# Patient Record
Sex: Female | Born: 2000 | Race: Black or African American | Hispanic: No | Marital: Single | Smoking: Current some day smoker
Health system: Southern US, Community
[De-identification: ages and names within clinical notes are randomized; demographics above are authoritative.]

## PROBLEM LIST (undated history)

## (undated) DIAGNOSIS — Z789 Other specified health status: Secondary | ICD-10-CM

## (undated) DIAGNOSIS — H9191 Unspecified hearing loss, right ear: Secondary | ICD-10-CM

## (undated) HISTORY — DX: Unspecified hearing loss, right ear: H91.91

## (undated) HISTORY — PX: INTRAUTERINE DEVICE (IUD) INSERTION: SHX5877

---
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
## 2021-05-09 ENCOUNTER — Ambulatory Visit: Admit: 2021-05-09 | Payer: Medicaid Other

## 2021-06-16 ENCOUNTER — Emergency Department (HOSPITAL_BASED_OUTPATIENT_CLINIC_OR_DEPARTMENT_OTHER)
Admission: EM | Admit: 2021-06-16 | Discharge: 2021-06-16 | Disposition: A | Discharge status: Home / Self Care | Payer: BLUE CROSS/BLUE SHIELD | Attending: Emergency Medicine | Admitting: Emergency Medicine

## 2021-06-16 ENCOUNTER — Other Ambulatory Visit: Payer: Self-pay

## 2021-06-16 ENCOUNTER — Encounter (HOSPITAL_BASED_OUTPATIENT_CLINIC_OR_DEPARTMENT_OTHER): Payer: Self-pay

## 2021-06-16 DIAGNOSIS — R21 Rash and other nonspecific skin eruption: Secondary | ICD-10-CM | POA: Diagnosis present

## 2021-06-16 DIAGNOSIS — L509 Urticaria, unspecified: Secondary | ICD-10-CM | POA: Insufficient documentation

## 2021-06-16 MED ORDER — FAMOTIDINE 20 MG PO TABS
20.0000 mg | ORAL_TABLET | Freq: Once | ORAL | Status: AC
  Administered 2021-06-16: 20 mg via ORAL
  Filled 2021-06-16: qty 1

## 2021-06-16 MED ORDER — FAMOTIDINE 20 MG PO TABS: 20.0000 mg | ORAL_TABLET | Freq: Every day | ORAL | 0 refills | Status: AC

## 2021-06-16 MED ORDER — DIPHENHYDRAMINE HCL 25 MG PO TABS: 25.0000 mg | ORAL_TABLET | Freq: Four times a day (QID) | ORAL | 0 refills | Status: AC

## 2021-06-16 MED ORDER — DIPHENHYDRAMINE HCL 25 MG PO CAPS
25.0000 mg | ORAL_CAPSULE | Freq: Once | ORAL | Status: AC
  Administered 2021-06-16: 25 mg via ORAL
  Filled 2021-06-16: qty 1

## 2021-06-16 NOTE — ED Triage Notes (Signed)
Pt presents POV with rash to face, torso, back and legs starting yesterday.  No angioedema, denies any shortness of breath or difficulty breathing.

## 2021-06-16 NOTE — ED Provider Notes (Signed)
[facility] EMERGENCY DEPT Provider Note  CSN: [id number] Arrival date & time: 06/16/21 2053  Chief Complaint(s) Rash  HPI Melanie Lam is a 20 y.o. female   The history is provided by the patient.  Rash Location:  Full body Quality: itchiness   Severity:  Moderate Onset quality:  Gradual Duration:  1 day Timing:  Constant Progression:  Worsening Chronicity:  New Relieved by:  Antihistamines Worsened by:  Nothing Associated symptoms: no abdominal pain, no fatigue, no fever, no headaches, no myalgias, no nausea, no periorbital edema, no shortness of breath, no throat swelling, no tongue swelling and not wheezing    Past Medical History Past Medical History:  Diagnosis Date   Hearing loss in right ear    Patient Active Problem List   Diagnosis Date Noted   Menorrhagia with regular cycle 10/17/2015   Iron deficiency anemia 10/17/2015   Family history of thrombosis in first degree relative 10/17/2015   Home Medication(s) Prior to Admission medications   Medication Sig Start Date End Date Taking? Authorizing Provider  diphenhydrAMINE (BENADRYL) 25 MG tablet Take 1 tablet (25 mg total) by mouth every 6 (six) hours for 5 days. 06/16/21 06/21/21 Yes Chesky Heyer, Amadeo Garnet, MD  famotidine (PEPCID) 20 MG tablet Take 1 tablet (20 mg total) by mouth daily for 5 days. 06/16/21 06/21/21 Yes Aydan Levitz, Amadeo Garnet, MD  levonorgestrel (MIRENA) 20 MCG/24HR IUD 1 each by Intrauterine route once.    [provider]                                                                                                                                    Past Surgical History History reviewed. No pertinent surgical history. Family History Family History  Problem Relation Age of Onset   Stroke Mother    Stroke Paternal Aunt    Pulmonary embolism Maternal Grandmother     Social History Social History   Tobacco Use   Smoking status: Never    Passive exposure: Yes    Smokeless tobacco: Never   Tobacco comments:    mom in the home   Vaping Use   Vaping Use: Never used  Substance Use Topics   Alcohol use: Never    Alcohol/week: 0.0 standard drinks   Drug use: Never   Allergies Patient has no known allergies.  Review of Systems Review of Systems  Constitutional:  Negative for fatigue and fever.  Respiratory:  Negative for shortness of breath and wheezing.   Gastrointestinal:  Negative for abdominal pain and nausea.  Musculoskeletal:  Negative for myalgias.  Skin:  Positive for rash.  Neurological:  Negative for headaches.  All other systems are reviewed and are negative for acute change except as noted in the HPI  Physical Exam Vital Signs  I have reviewed the triage vital signs BP 104/71 (BP Location: Right Arm)   Pulse 77   Temp 98 F (36.7 C) (Oral)  Resp 15   Ht 5\' 4"  (1.626 m)   Wt 63.5 kg   SpO2 100%   BMI 24.03 kg/m   Physical Exam Vitals reviewed.  Constitutional:      General: She is not in acute distress.    Appearance: She is well-developed. She is not diaphoretic.  HENT:     Head: Normocephalic and atraumatic.     Nose: Nose normal.     Mouth/Throat:     Mouth: No angioedema.  Eyes:     General: No scleral icterus.       Right eye: No discharge.        Left eye: No discharge.     Conjunctiva/sclera: Conjunctivae normal.     Pupils: Pupils are equal, round, and reactive to light.  Cardiovascular:     Rate and Rhythm: Normal rate and regular rhythm.     Heart sounds: No murmur heard.   No friction rub. No gallop.  Pulmonary:     Effort: Pulmonary effort is normal. No respiratory distress.     Breath sounds: Normal breath sounds. No stridor. No rales.  Abdominal:     General: There is no distension.     Palpations: Abdomen is soft.     Tenderness: There is no abdominal tenderness.  Musculoskeletal:        General: No tenderness.     Cervical back: Normal range of motion and neck supple.  Skin:    General:  Skin is warm and dry.     Findings: Rash present. No erythema. Rash is urticarial (diffuse - to face,neck, torso, and extremities).  Neurological:     Mental Status: She is alert and oriented to person, place, and time.    ED Results and Treatments Labs (all labs ordered are listed, but only abnormal results are displayed) Labs Reviewed - No data to display                                                                                                                       EKG  EKG Interpretation  Date/Time:    Ventricular Rate:    PR Interval:    QRS Duration:   QT Interval:    QTC Calculation:   R Axis:     Text Interpretation:         Radiology No results found.  Pertinent labs & imaging results that were available during my care of the patient were reviewed by me and considered in my medical decision making (see MDM for details).  Medications Ordered in ED Medications  diphenhydrAMINE (BENADRYL) capsule 25 mg (has no administration in time range)  famotidine (PEPCID) tablet 20 mg (has no administration in time range)  Procedures Procedures  (including critical care time)  Medical Decision Making / ED Course I have reviewed the nursing notes for this encounter and the patient's prior records (if available in EHR or on provided paperwork).  Melanie Lam was evaluated in Emergency Department on 06/16/2021 for the symptoms described in the history of present illness. She was evaluated in the context of the global COVID-19 pandemic, which necessitated consideration that the patient might be at risk for infection with the SARS-CoV-2 virus that causes COVID-19. Institutional protocols and algorithms that pertain to the evaluation of patients at risk for COVID-19 are in a state of rapid change based on information released by regulatory bodies  including the CDC and federal and state organizations. These policies and algorithms were followed during the patient's care in the ED.     20 y.o. female here with pruritic rash. No known triggers or exposures. No respiratory, GI, or neurologic symptoms to suggest anaphylaxis. No recent infectious symptoms suggestive of viral urticaria.    On exam, there is no evidence of oral swelling or airway compromise.   Given Benadryl, H2 blocker.   Safe for discharge with strict return precautions. Given Rx for H2 blocker.   Final Clinical Impression(s) / ED Diagnoses Final diagnoses:  Urticaria   The patient appears reasonably screened and/or stabilized for discharge and I doubt any other medical condition or other [facility] requiring further screening, evaluation, or treatment in the ED at this time prior to discharge. Safe for discharge with strict return precautions.  Disposition: Discharge  Condition: Good  I have discussed the results, Dx and Tx plan with the patient/family who expressed understanding and agree(s) with the plan. Discharge instructions discussed at length. The patient/family was given strict return precautions who verbalized understanding of the instructions. No further questions at time of discharge.    ED Discharge Orders          Ordered    diphenhydrAMINE (BENADRYL) 25 MG tablet  Every 6 hours        06/16/21 2308    famotidine (PEPCID) 20 MG tablet  Daily        06/16/21 2308            Follow Up: Silvano Rusk, MD [facility] [address], [address] [place] [address] [phone]  Call in 1 week if symptoms do not improve or  worsen     This chart was dictated using voice recognition software.  Despite best efforts to proofread,  errors can occur which can change the documentation meaning.    Nira Conn, MD 06/16/21 [phone]

## 2021-08-02 ENCOUNTER — Other Ambulatory Visit: Payer: Self-pay

## 2021-08-02 ENCOUNTER — Ambulatory Visit
Admission: EM | Admit: 2021-08-02 | Discharge: 2021-08-02 | Disposition: A | Discharge status: Home / Self Care | Payer: Medicaid Other | Attending: Internal Medicine | Admitting: Internal Medicine

## 2021-08-02 ENCOUNTER — Encounter: Payer: Self-pay | Admitting: Emergency Medicine

## 2021-08-02 DIAGNOSIS — B349 Viral infection, unspecified: Secondary | ICD-10-CM | POA: Diagnosis present

## 2021-08-02 DIAGNOSIS — J029 Acute pharyngitis, unspecified: Secondary | ICD-10-CM | POA: Insufficient documentation

## 2021-08-02 LAB — POCT RAPID STREP A (OFFICE): Rapid Strep A Screen: NEGATIVE

## 2021-08-02 NOTE — Discharge Instructions (Signed)
Rapid strep test was negative.  It appears that you have a viral illness that should resolve on its own in the next few days.  COVID-19 and flu test pending.  We will call if it is positive.  Please treat with Cepacol throat lozenges and over-the-counter cold and flu medication.

## 2021-08-02 NOTE — ED Triage Notes (Signed)
Patient c/o sore throat and fever this morning, some dry cough.  Denies any OTC meds.

## 2021-08-02 NOTE — ED Provider Notes (Signed)
[facility]    CSN: [id number] Arrival date & time: 08/02/21  1022      History   Chief Complaint Chief Complaint  Patient presents with   Sore Throat    HPI Melanie Lam is a 21 y.o. female.   Patient presents with sore throat, fever, dry cough, runny nose that started this morning.  T-max at home was 100.  Denies any known sick contacts.  Denies chest pain, shortness of breath, ear pain, nausea, vomiting, diarrhea, abdominal pain.  Patient has not taken any medications to help alleviate symptoms.   Sore Throat   Past Medical History:  Diagnosis Date   Hearing loss in right ear     Patient Active Problem List   Diagnosis Date Noted   Menorrhagia with regular cycle 10/17/2015   Iron deficiency anemia 10/17/2015   Family history of thrombosis in first degree relative 10/17/2015    History reviewed. No pertinent surgical history.  OB History   No obstetric history on file.      Home Medications    Prior to Admission medications   Medication Sig Start Date End Date Taking? Authorizing Provider  levonorgestrel (MIRENA) 20 MCG/24HR IUD 1 each by Intrauterine route once.   Yes [provider]  diphenhydrAMINE (BENADRYL) 25 MG tablet Take 1 tablet (25 mg total) by mouth every 6 (six) hours for 5 days. 06/16/21 06/21/21  Nira Conn, MD  famotidine (PEPCID) 20 MG tablet Take 1 tablet (20 mg total) by mouth daily for 5 days. 06/16/21 06/21/21  Nira Conn, MD    Family History Family History  Problem Relation Age of Onset   Stroke Mother    Stroke Paternal Aunt    Pulmonary embolism Maternal Grandmother     Social History Social History   Tobacco Use   Smoking status: Never    Passive exposure: Yes   Smokeless tobacco: Never   Tobacco comments:    mom in the home   Vaping Use   Vaping Use: Never used  Substance Use Topics   Alcohol use: Never    Alcohol/week: 0.0 standard drinks   Drug use: Never      Allergies   Patient has no known allergies.   Review of Systems Review of Systems Per HPI  Physical Exam Triage Vital Signs ED Triage Vitals  Enc Vitals Group     BP 08/02/21 1051 99/66     Pulse Rate 08/02/21 1051 71     Resp 08/02/21 1051 18     Temp 08/02/21 1051 99 F (37.2 C)     Temp Source 08/02/21 1051 Oral     SpO2 08/02/21 1051 99 %     Weight 08/02/21 1052 135 lb (61.2 kg)     Height 08/02/21 1052 5\' 4"  (1.626 m)     Head Circumference --      Peak Flow --      Pain Score 08/02/21 1052 8     Pain Loc --      Pain Edu? --      Excl. in GC? --    No data found.  Updated Vital Signs BP 99/66 (BP Location: Left Arm)    Pulse 71    Temp 99 F (37.2 C) (Oral)    Resp 18    Ht 5\' 4"  (1.626 m)    Wt 135 lb (61.2 kg)    SpO2 99%    BMI 23.17 kg/m   Visual Acuity Right Eye Distance:  Left Eye Distance:   Bilateral Distance:    Right Eye Near:   Left Eye Near:    Bilateral Near:     Physical Exam Constitutional:      General: She is not in acute distress.    Appearance: Normal appearance. She is not toxic-appearing.  HENT:     Head: Normocephalic and atraumatic.     Right Ear: Tympanic membrane and ear canal normal.     Left Ear: Tympanic membrane and ear canal normal.     Nose: Congestion present.     Mouth/Throat:     Mouth: Mucous membranes are moist.     Pharynx: Posterior oropharyngeal erythema present.  Eyes:     Extraocular Movements: Extraocular movements intact.     Conjunctiva/sclera: Conjunctivae normal.     Pupils: Pupils are equal, round, and reactive to light.  Cardiovascular:     Rate and Rhythm: Normal rate and regular rhythm.     Pulses: Normal pulses.     Heart sounds: Normal heart sounds.  Pulmonary:     Effort: Pulmonary effort is normal. No respiratory distress.     Breath sounds: Normal breath sounds. No stridor. No wheezing, rhonchi or rales.  Abdominal:     General: Abdomen is flat. Bowel sounds are normal.      Palpations: Abdomen is soft.  Musculoskeletal:        General: Normal range of motion.     Cervical back: Normal range of motion.  Skin:    General: Skin is warm and dry.  Neurological:     General: No focal deficit present.     Mental Status: She is alert and oriented to person, place, and time. Mental status is at baseline.  Psychiatric:        Mood and Affect: Mood normal.        Behavior: Behavior normal.     UC Treatments / Results  Labs (all labs ordered are listed, but only abnormal results are displayed) Labs Reviewed  CULTURE, GROUP A STREP ([facility])  COVID-19, FLU A+B NAA  POCT RAPID STREP A (OFFICE)    EKG   Radiology No results found.  Procedures Procedures (including critical care time)  Medications Ordered in UC Medications - No data to display  Initial Impression / Assessment and Plan / UC Course  I have reviewed the triage vital signs and the nursing notes.  Pertinent labs & imaging results that were available during my care of the patient were reviewed by me and considered in my medical decision making (see chart for details).     Patient presents with symptoms likely from a viral upper respiratory infection. Differential includes bacterial pneumonia, sinusitis, allergic rhinitis, COVID-19, flu. Do not suspect underlying cardiopulmonary process. Symptoms seem unlikely related to ACS, CHF or COPD exacerbations, pneumonia, pneumothorax. Patient is nontoxic appearing and not in need of emergent medical intervention.  Strep test was negative.  Throat culture, COVID-19, flu test pending.  Recommended symptom control with over the counter medications: Daily oral anti-histamine, Oral decongestant or IN corticosteroid, saline irrigations, cepacol lozenges, Robitussin, Delsym, honey tea.  Return if symptoms fail to improve in 1-2 weeks or you develop shortness of breath, chest pain, severe headache. Patient states understanding and is agreeable.  Discharged with  PCP followup.  Final Clinical Impressions(s) / UC Diagnoses   Final diagnoses:  Viral illness  Sore throat     Discharge Instructions      Rapid strep test was negative.  It appears  that you have a viral illness that should resolve on its own in the next few days.  COVID-19 and flu test pending.  We will call if it is positive.  Please treat with Cepacol throat lozenges and over-the-counter cold and flu medication.    ED Prescriptions   None    PDMP not reviewed this encounter.   Gustavus Bryant, [place] 08/02/21 1133

## 2021-08-03 LAB — COVID-19, FLU A+B NAA
Influenza A, NAA: NOT DETECTED
Influenza B, NAA: NOT DETECTED
SARS-CoV-2, NAA: NOT DETECTED

## 2021-08-05 LAB — CULTURE, GROUP A STREP (THRC)

## 2022-04-14 ENCOUNTER — Ambulatory Visit: Payer: Self-pay
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
## 2023-07-09 ENCOUNTER — Ambulatory Visit
Admission: RE | Admit: 2023-07-09 | Discharge: 2023-07-09 | Disposition: A | Discharge status: Home / Self Care | Payer: BC Managed Care – PPO | Source: Ambulatory Visit | Attending: Family Medicine | Admitting: Family Medicine

## 2023-07-09 VITALS — BP 107/73 | HR 76 | Temp 98.3°F | Resp 16

## 2023-07-09 DIAGNOSIS — J018 Other acute sinusitis: Secondary | ICD-10-CM

## 2023-07-09 MED ORDER — PROMETHAZINE-DM 6.25-15 MG/5ML PO SYRP: 5.0000 mL | ORAL_SOLUTION | Freq: Three times a day (TID) | ORAL | 0 refills | Status: DC | PRN

## 2023-07-09 MED ORDER — AMOXICILLIN 875 MG PO TABS: 875.0000 mg | ORAL_TABLET | Freq: Two times a day (BID) | ORAL | 0 refills | Status: DC

## 2023-07-09 MED ORDER — CETIRIZINE HCL 10 MG PO TABS: 10.0000 mg | ORAL_TABLET | Freq: Every day | ORAL | 0 refills | Status: AC

## 2023-07-09 MED ORDER — PSEUDOEPHEDRINE HCL 60 MG PO TABS: 60.0000 mg | ORAL_TABLET | Freq: Three times a day (TID) | ORAL | 0 refills | Status: AC | PRN

## 2023-07-09 NOTE — Discharge Instructions (Signed)
We will manage this as a sinus infection with amoxicillin. For sore throat or cough try using a honey-based tea. Use 3 teaspoons of honey with juice squeezed from half lemon. Place shaved pieces of ginger into 1/2-1 cup of water and warm over stove top. Then mix the ingredients and repeat every 4 hours as needed. Please take ibuprofen 600mg every 6 hours with food alternating with OR taken together with Tylenol 500mg-650mg every 6 hours for throat pain, fevers, aches and pains. Hydrate very well with at least 2 liters of water. Eat light meals such as soups (chicken and noodles, vegetable, chicken and wild rice).  Do not eat foods that you are allergic to.  Taking an antihistamine like Zyrtec can help against postnasal drainage, sinus congestion which can cause sinus pain, sinus headaches, throat pain, painful swallowing, coughing.  You can take this together with pseudoephedrine (Sudafed) at a dose of 60 mg 3 times a day or twice daily as needed for the same kind of nasal drip, congestion.  Use cough medication as needed.   

## 2023-07-09 NOTE — ED Triage Notes (Signed)
 Pt c/o cough, head/chest congestion, HA, nausea-sx started 1 week ago-denies known fever-NAD-steady gait

## 2023-07-09 NOTE — ED Provider Notes (Signed)
 [facility]  Note:  This document was prepared using Conservation officer, historic buildings and may include unintentional dictation errors.  MRN: [id number] DOB: 10-06-2000  Subjective:   Melanie Lam is a 23 y.o. female presenting for 1 week history of acute onset persistent sinus congestion, chest congestion, sinus headaches, nausea, coughing.  Has significant pressure behind her ear on the right side.  No current facility-administered medications for this encounter.  Current Outpatient Medications:    diphenhydrAMINE  (BENADRYL ) 25 MG tablet, Take 1 tablet (25 mg total) by mouth every 6 (six) hours for 5 days., Disp: 20 tablet, Rfl: 0   famotidine  (PEPCID ) 20 MG tablet, Take 1 tablet (20 mg total) by mouth daily for 5 days., Disp: 5 tablet, Rfl: 0   levonorgestrel  (MIRENA ) 20 MCG/24HR IUD, 1 each by Intrauterine route once., Disp: , Rfl:    No Known Allergies  Past Medical History:  Diagnosis Date   Hearing loss in right ear      History reviewed. No pertinent surgical history.  Family History  Problem Relation Age of Onset   Stroke Mother    Stroke Paternal Aunt    Pulmonary embolism Maternal Grandmother     Social History   Tobacco Use   Smoking status: Never    Passive exposure: Yes   Smokeless tobacco: Never   Tobacco comments:    mom in the home   Vaping Use   Vaping status: Never Used  Substance Use Topics   Alcohol use: Never    Alcohol/week: 0.0 standard drinks of alcohol   Drug use: Never    ROS   Objective:   Vitals: BP 107/73 (BP Location: Left Arm)   Pulse 76   Temp 98.3 F (36.8 C) (Oral)   Resp 16   SpO2 98%   Physical Exam Constitutional:      General: She is not in acute distress.    Appearance: Normal appearance. She is well-developed and normal weight. She is not ill-appearing, toxic-appearing or diaphoretic.  HENT:     Head: Normocephalic and atraumatic.     Right Ear: Ear canal and external ear normal. No  drainage or tenderness. No middle ear effusion. There is no impacted cerumen. Tympanic membrane is not erythematous or bulging.     Left Ear: Tympanic membrane, ear canal and external ear normal. No drainage or tenderness.  No middle ear effusion. There is no impacted cerumen. Tympanic membrane is not erythematous or bulging.     Ears:     Comments: Right middle ear effusion.    Nose: Congestion present. No rhinorrhea.     Mouth/Throat:     Mouth: Mucous membranes are moist. No oral lesions.     Pharynx: No pharyngeal swelling, oropharyngeal exudate, posterior oropharyngeal erythema or uvula swelling.     Tonsils: No tonsillar exudate or tonsillar abscesses.  Eyes:     General: No scleral icterus.       Right eye: No discharge.        Left eye: No discharge.     Extraocular Movements: Extraocular movements intact.     Right eye: Normal extraocular motion.     Left eye: Normal extraocular motion.     Conjunctiva/sclera: Conjunctivae normal.  Cardiovascular:     Rate and Rhythm: Normal rate and regular rhythm.     Heart sounds: Normal heart sounds. No murmur heard.    No friction rub. No gallop.  Pulmonary:     Effort: Pulmonary effort is  normal. No respiratory distress.     Breath sounds: No stridor. No wheezing, rhonchi or rales.  Chest:     Chest wall: No tenderness.  Musculoskeletal:     Cervical back: Normal range of motion and neck supple.  Lymphadenopathy:     Cervical: No cervical adenopathy.  Skin:    General: Skin is warm and dry.  Neurological:     General: No focal deficit present.     Mental Status: She is alert and oriented to person, place, and time.     Cranial Nerves: No cranial nerve deficit.     Motor: No weakness.     Coordination: Coordination normal.     Gait: Gait normal.  Psychiatric:        Mood and Affect: Mood normal.        Behavior: Behavior normal.        Thought Content: Thought content normal.        Judgment: Judgment normal.      Assessment and Plan :   PDMP not reviewed this encounter.  1. Acute non-recurrent sinusitis of other sinus    Deferred imaging given clear cardiopulmonary exam, hemodynamically stable vital signs. Will start empiric treatment for sinusitis with amoxicillin .  Recommended supportive care otherwise. Counseled patient on potential for adverse effects with medications prescribed/recommended today, ER and return-to-clinic precautions discussed, patient verbalized understanding.    Christopher Savannah, [place] 07/09/23 [id number]

## 2023-07-13 ENCOUNTER — Ambulatory Visit: Payer: Self-pay

## 2023-08-12 ENCOUNTER — Other Ambulatory Visit: Payer: Self-pay

## 2023-08-12 ENCOUNTER — Emergency Department (HOSPITAL_COMMUNITY)
Admission: EM | Admit: 2023-08-12 | Discharge: 2023-08-12 | Discharge status: Against Medical Advice | Payer: Federal, State, Local not specified - PPO | Attending: Emergency Medicine | Admitting: Emergency Medicine

## 2023-08-12 ENCOUNTER — Encounter (HOSPITAL_COMMUNITY): Payer: Self-pay

## 2023-08-12 DIAGNOSIS — W01198A Fall on same level from slipping, tripping and stumbling with subsequent striking against other object, initial encounter: Secondary | ICD-10-CM | POA: Insufficient documentation

## 2023-08-12 DIAGNOSIS — R42 Dizziness and giddiness: Secondary | ICD-10-CM | POA: Diagnosis not present

## 2023-08-12 DIAGNOSIS — R519 Headache, unspecified: Secondary | ICD-10-CM | POA: Diagnosis not present

## 2023-08-12 DIAGNOSIS — Z5321 Procedure and treatment not carried out due to patient leaving prior to being seen by health care provider: Secondary | ICD-10-CM | POA: Insufficient documentation

## 2023-08-12 MED ORDER — ACETAMINOPHEN 325 MG PO TABS
650.0000 mg | ORAL_TABLET | Freq: Once | ORAL | Status: AC
  Administered 2023-08-12: 650 mg via ORAL
  Filled 2023-08-12: qty 2

## 2023-08-12 NOTE — ED Triage Notes (Signed)
 Reports ETOH use last night. Denies taking medication for headache PTA.

## 2023-08-12 NOTE — ED Triage Notes (Signed)
 Pt presents via POV c/o headache. Reports fell and hit head yesterday. Reports dizziness. Ambulatory to triage. A&O x4.   Reports she does get migraines.

## 2023-09-17 DIAGNOSIS — F411 Generalized anxiety disorder: Secondary | ICD-10-CM | POA: Diagnosis not present

## 2023-09-17 DIAGNOSIS — F4310 Post-traumatic stress disorder, unspecified: Secondary | ICD-10-CM | POA: Diagnosis not present

## 2023-09-17 DIAGNOSIS — F3163 Bipolar disorder, current episode mixed, severe, without psychotic features: Secondary | ICD-10-CM | POA: Diagnosis not present

## 2023-09-22 DIAGNOSIS — B3731 Acute candidiasis of vulva and vagina: Secondary | ICD-10-CM | POA: Diagnosis not present

## 2023-09-22 DIAGNOSIS — Z30431 Encounter for routine checking of intrauterine contraceptive device: Secondary | ICD-10-CM | POA: Diagnosis not present

## 2023-09-22 DIAGNOSIS — N760 Acute vaginitis: Secondary | ICD-10-CM | POA: Diagnosis not present

## 2023-09-22 DIAGNOSIS — N898 Other specified noninflammatory disorders of vagina: Secondary | ICD-10-CM | POA: Diagnosis not present

## 2023-10-01 DIAGNOSIS — N920 Excessive and frequent menstruation with regular cycle: Secondary | ICD-10-CM | POA: Diagnosis not present

## 2023-10-01 DIAGNOSIS — G43009 Migraine without aura, not intractable, without status migrainosus: Secondary | ICD-10-CM | POA: Diagnosis not present

## 2023-10-11 ENCOUNTER — Encounter (HOSPITAL_COMMUNITY): Payer: Self-pay | Admitting: Family Medicine

## 2023-10-11 NOTE — Progress Notes (Signed)
 Spoke w/ via phone for pre-op interview--- [facility] Lab needs dos----  CBC, RPR and T&S per surgeon. UPT per anesthesia.       Lab results------ COVID test -----patient states asymptomatic no test needed Arrive at -------0800 NPO after MN NO Solid Food.  Clear liquids from MN until---0700 Pre-Surgery Ensure or G2:  Med rec completed Medications to take morning of surgery -----NONE Diabetic medication -----  GLP1 agonist last dose: GLP1 instructions:  Patient instructed no nail polish to be worn day of surgery Patient instructed to bring photo id and insurance card day of surgery Patient aware to have Driver (ride ) / caregiver    for 24 hours after surgery - Elizebeth Brooking Patient Special Instructions ----- shower with antibacterial soap. Pre-Op special Instructions -----  Patient verbalized understanding of instructions that were given at this phone interview. Patient denies chest pain, sob, fever, cough at the interview.

## 2023-10-19 MED ORDER — LEVONORGESTREL 20 MCG/DAY IU IUD
1.0000 | INTRAUTERINE_SYSTEM | INTRAUTERINE | Status: AC
  Administered 2023-10-20: 1 via INTRAUTERINE
  Filled 2023-10-19 (×2): qty 1

## 2023-10-19 NOTE — Anesthesia Preprocedure Evaluation (Signed)
 Anesthesia Evaluation  Patient identified by MRN, date of birth, ID band Patient awake    Reviewed: Allergy & Precautions, NPO status , Patient's Chart, lab work & pertinent test results  Airway Mallampati: II  TM Distance: >3 FB Neck ROM: Full    Dental no notable dental hx. (+) Teeth Intact, Dental Advisory Given   Pulmonary Current Smoker and Patient abstained from smoking.   Pulmonary exam normal breath sounds clear to auscultation       Cardiovascular negative cardio ROS Normal cardiovascular exam Rhythm:Regular Rate:Normal     Neuro/Psych negative neurological ROS  negative psych ROS   GI/Hepatic negative GI ROS, Neg liver ROS,,,  Endo/Other  negative endocrine ROS    Renal/GU negative Renal ROS  negative genitourinary   Musculoskeletal negative musculoskeletal ROS (+)    Abdominal   Peds  Hematology negative hematology ROS (+)   Anesthesia Other Findings   Reproductive/Obstetrics negative OB ROS                             Anesthesia Physical Anesthesia Plan  ASA: 2  Anesthesia Plan: MAC   Post-op Pain Management: Tylenol PO (pre-op)* and Toradol IV (intra-op)*   Induction: Intravenous  PONV Risk Score and Plan: 2 and Ondansetron, Dexamethasone, Midazolam, Treatment may vary due to age or medical condition, Propofol infusion and Scopolamine patch - Pre-op  Airway Management Planned: Natural Airway and Simple Face Mask  Additional Equipment: None  Intra-op Plan:   Post-operative Plan:   Informed Consent: I have reviewed the patients History and Physical, chart, labs and discussed the procedure including the risks, benefits and alternatives for the proposed anesthesia with the patient or authorized representative who has indicated his/her understanding and acceptance.     Dental advisory given  Plan Discussed with: CRNA  Anesthesia Plan Comments:         Anesthesia Quick Evaluation

## 2023-10-20 ENCOUNTER — Other Ambulatory Visit: Payer: Self-pay

## 2023-10-20 ENCOUNTER — Ambulatory Visit (HOSPITAL_COMMUNITY): Admitting: Anesthesiology

## 2023-10-20 ENCOUNTER — Encounter (HOSPITAL_COMMUNITY)
Admission: RE | Disposition: A | Discharge status: Home / Self Care | Payer: Self-pay | Source: Home / Self Care | Attending: Family Medicine

## 2023-10-20 ENCOUNTER — Encounter (HOSPITAL_COMMUNITY): Payer: Self-pay | Admitting: Family Medicine

## 2023-10-20 ENCOUNTER — Ambulatory Visit (HOSPITAL_COMMUNITY)
Admission: RE | Admit: 2023-10-20 | Discharge: 2023-10-20 | Disposition: A | Discharge status: Home / Self Care | Attending: Family Medicine | Admitting: Family Medicine

## 2023-10-20 DIAGNOSIS — Z01818 Encounter for other preprocedural examination: Secondary | ICD-10-CM

## 2023-10-20 DIAGNOSIS — N920 Excessive and frequent menstruation with regular cycle: Secondary | ICD-10-CM

## 2023-10-20 DIAGNOSIS — Z30433 Encounter for removal and reinsertion of intrauterine contraceptive device: Secondary | ICD-10-CM | POA: Insufficient documentation

## 2023-10-20 DIAGNOSIS — D500 Iron deficiency anemia secondary to blood loss (chronic): Secondary | ICD-10-CM | POA: Insufficient documentation

## 2023-10-20 DIAGNOSIS — Z975 Presence of (intrauterine) contraceptive device: Secondary | ICD-10-CM | POA: Diagnosis not present

## 2023-10-20 HISTORY — PX: IUD REMOVAL: SHX5392

## 2023-10-20 HISTORY — PX: INTRAUTERINE DEVICE (IUD) INSERTION: SHX5877

## 2023-10-20 HISTORY — DX: Other specified health status: Z78.9

## 2023-10-20 LAB — TYPE AND SCREEN
ABO/RH(D): O POS
Antibody Screen: NEGATIVE

## 2023-10-20 LAB — CBC
HCT: 38 % (ref 36.0–46.0)
Hemoglobin: 12.2 g/dL (ref 12.0–15.0)
MCH: 28.4 pg (ref 26.0–34.0)
MCHC: 32.1 g/dL (ref 30.0–36.0)
MCV: 88.4 fL (ref 80.0–100.0)
Platelets: 197 10*3/uL (ref 150–400)
RBC: 4.3 MIL/uL (ref 3.87–5.11)
RDW: 14.4 % (ref 11.5–15.5)
WBC: 6.8 10*3/uL (ref 4.0–10.5)
nRBC: 0 % (ref 0.0–0.2)

## 2023-10-20 LAB — ABO/RH: ABO/RH(D): O POS

## 2023-10-20 LAB — POCT PREGNANCY, URINE: Preg Test, Ur: NEGATIVE

## 2023-10-20 SURGERY — REMOVAL, INTRAUTERINE DEVICE
Anesthesia: Monitor Anesthesia Care

## 2023-10-20 MED ORDER — MIDAZOLAM HCL 2 MG/2ML IJ SOLN
INTRAMUSCULAR | Status: AC
  Filled 2023-10-20: qty 2

## 2023-10-20 MED ORDER — IBUPROFEN 800 MG PO TABS
800.0000 mg | ORAL_TABLET | Freq: Three times a day (TID) | ORAL | 0 refills | Status: AC | PRN
Start: 2023-10-20 — End: ?

## 2023-10-20 MED ORDER — CHLORHEXIDINE GLUCONATE 0.12 % MT SOLN
OROMUCOSAL | Status: AC
  Filled 2023-10-20: qty 15

## 2023-10-20 MED ORDER — PROPOFOL 10 MG/ML IV BOLUS
INTRAVENOUS | Status: DC | PRN
  Administered 2023-10-20 (×4): 20 mg via INTRAVENOUS
  Administered 2023-10-20: 50 mg via INTRAVENOUS
  Administered 2023-10-20 (×3): 20 mg via INTRAVENOUS

## 2023-10-20 MED ORDER — LIDOCAINE 2% (20 MG/ML) 5 ML SYRINGE
INTRAMUSCULAR | Status: DC | PRN
  Administered 2023-10-20: 60 mg via INTRAVENOUS

## 2023-10-20 MED ORDER — KETOROLAC TROMETHAMINE 30 MG/ML IJ SOLN
INTRAMUSCULAR | Status: AC
  Filled 2023-10-20: qty 1

## 2023-10-20 MED ORDER — SCOPOLAMINE 1 MG/3DAYS TD PT72
1.0000 | MEDICATED_PATCH | TRANSDERMAL | Status: DC
  Administered 2023-10-20: 1.5 mg via TRANSDERMAL

## 2023-10-20 MED ORDER — LACTATED RINGERS IV SOLN: INTRAVENOUS | Status: DC

## 2023-10-20 MED ORDER — PROPOFOL 10 MG/ML IV BOLUS
INTRAVENOUS | Status: AC
  Filled 2023-10-20: qty 20

## 2023-10-20 MED ORDER — DEXAMETHASONE SODIUM PHOSPHATE 10 MG/ML IJ SOLN
INTRAMUSCULAR | Status: AC
  Filled 2023-10-20: qty 1

## 2023-10-20 MED ORDER — FENTANYL CITRATE (PF) 250 MCG/5ML IJ SOLN
INTRAMUSCULAR | Status: AC
  Filled 2023-10-20: qty 5

## 2023-10-20 MED ORDER — SCOPOLAMINE 1 MG/3DAYS TD PT72
MEDICATED_PATCH | TRANSDERMAL | Status: AC
  Filled 2023-10-20: qty 1

## 2023-10-20 MED ORDER — KETOROLAC TROMETHAMINE 30 MG/ML IJ SOLN
INTRAMUSCULAR | Status: DC | PRN
Start: 2023-10-20 — End: 2023-10-20
  Administered 2023-10-20: 30 mg via INTRAVENOUS

## 2023-10-20 MED ORDER — ACETAMINOPHEN 500 MG PO TABS
1000.0000 mg | ORAL_TABLET | Freq: Once | ORAL | Status: AC
  Administered 2023-10-20: 1000 mg via ORAL

## 2023-10-20 MED ORDER — ONDANSETRON HCL 4 MG/2ML IJ SOLN
INTRAMUSCULAR | Status: AC
  Filled 2023-10-20: qty 2

## 2023-10-20 MED ORDER — LIDOCAINE 2% (20 MG/ML) 5 ML SYRINGE
INTRAMUSCULAR | Status: AC
  Filled 2023-10-20: qty 5

## 2023-10-20 MED ORDER — BUPIVACAINE HCL (PF) 0.25 % IJ SOLN
INTRAMUSCULAR | Status: AC
  Filled 2023-10-20: qty 30

## 2023-10-20 MED ORDER — ORAL CARE MOUTH RINSE: 15.0000 mL | Freq: Once | OROMUCOSAL | Status: AC

## 2023-10-20 MED ORDER — SILVER NITRATE-POT NITRATE 75-25 % EX MISC
CUTANEOUS | Status: DC | PRN
  Administered 2023-10-20: 2

## 2023-10-20 MED ORDER — CHLORHEXIDINE GLUCONATE 0.12 % MT SOLN
15.0000 mL | Freq: Once | OROMUCOSAL | Status: AC
  Administered 2023-10-20: 15 mL via OROMUCOSAL

## 2023-10-20 MED ORDER — ACETAMINOPHEN 500 MG PO TABS
ORAL_TABLET | ORAL | Status: AC
  Filled 2023-10-20: qty 2

## 2023-10-20 MED ORDER — FENTANYL CITRATE (PF) 250 MCG/5ML IJ SOLN
INTRAMUSCULAR | Status: DC | PRN
  Administered 2023-10-20: 50 ug via INTRAVENOUS
  Administered 2023-10-20: 100 ug via INTRAVENOUS

## 2023-10-20 MED ORDER — ONDANSETRON HCL 4 MG/2ML IJ SOLN
INTRAMUSCULAR | Status: DC | PRN
  Administered 2023-10-20: 4 mg via INTRAVENOUS

## 2023-10-20 MED ORDER — MIDAZOLAM HCL 2 MG/2ML IJ SOLN
INTRAMUSCULAR | Status: DC | PRN
  Administered 2023-10-20: 2 mg via INTRAVENOUS

## 2023-10-20 SURGICAL SUPPLY — 13 items
CANISTER SUCT 3000ML PPV (MISCELLANEOUS) ×1 IMPLANT
CATH ROBINSON RED A/P 16FR (CATHETERS) ×1 IMPLANT
DILATOR CANAL MILEX (MISCELLANEOUS) IMPLANT
GLOVE BIOGEL PI IND STRL 6.5 (GLOVE) ×1 IMPLANT
GLOVE SURG ENC TEXT LTX SZ6.5 (GLOVE) ×1 IMPLANT
GLOVE SURG UNDER POLY LF SZ7 (GLOVE) ×1 IMPLANT
GOWN STRL REUS W/ TWL LRG LVL3 (GOWN DISPOSABLE) ×2 IMPLANT
KIT TURNOVER KIT B (KITS) ×1 IMPLANT
MIRENA IUD IMPLANT
PACK VAGINAL MINOR WOMEN LF (CUSTOM PROCEDURE TRAY) ×1 IMPLANT
PAD OB MATERNITY 11 LF (PERSONAL CARE ITEMS) ×1 IMPLANT
TOWEL GREEN STERILE FF (TOWEL DISPOSABLE) ×2 IMPLANT
UNDERPAD 30X36 HEAVY ABSORB (UNDERPADS AND DIAPERS) ×1 IMPLANT

## 2023-10-20 NOTE — OR Nursing (Signed)
 IUD removed by Dr. Elisha Guillaume. IUD intact.

## 2023-10-20 NOTE — Transfer of Care (Signed)
 Immediate Anesthesia Transfer of Care Note  Patient: Tatayana Beshears  Procedure(s) Performed: REMOVAL, INTRAUTERINE DEVICE INSERTION, INTRAUTERINE DEVICE/ PAP SMEAR  Patient Location: PACU  Anesthesia Type:MAC  Level of Consciousness: awake and alert   Airway & Oxygen Therapy: Patient Spontanous Breathing  Post-op Assessment: Report given to RN and Post -op Vital signs reviewed and stable  Post vital signs: Reviewed and stable  Last Vitals:  Vitals Value Taken Time  BP    Temp    Pulse 70 10/20/23 1056  Resp 18 10/20/23 1056  SpO2 100 % 10/20/23 1056  Vitals shown include unfiled device data.  Last Pain:  Vitals:   10/20/23 0845  TempSrc: Oral  PainSc: 0-No pain      Patients Stated Pain Goal: 5 (10/20/23 0845)  Complications: No notable events documented.

## 2023-10-20 NOTE — H&P (Addendum)
 Melanie Lam is an 23 y.o. No obstetric history on file. who is presenting for IUD removal and insertion under anesthesia as well as pap collection with possible hysteroscopy w/ D&C assisted by Dr. Wynona Hedger if necessary.   Patient Active Problem List   Diagnosis Date Noted   Menorrhagia with regular cycle 10/17/2015   Iron deficiency anemia 10/17/2015   Family history of thrombosis in first degree relative 10/17/2015    Pertinent Gynecological History: IN REVEIW: -Mirena IUD placed in the 5 or 6th grade under anesthesia due to menorrhagia; removal desired today -1 year ago she started having HMB again; lasting for 2 weeks at a time -Affecting her job and ADLs -Hx of anemia; going through super tampon every 1.5 hours -Sexually active with same sex partners; denies any form of penetration outside of tampons Pap- Never done; has not had a speculum exam done outside of IUD placement under anesthesia.  MEDICAL/FAMILY/SOCIAL HX: Patient's last menstrual period was 10/02/2023.    Past Medical History:  Diagnosis Date   Hearing loss in right ear    Medical history non-contributory     History reviewed. No pertinent surgical history.  Family History  Problem Relation Age of Onset   Stroke Mother    Stroke Paternal Aunt    Pulmonary embolism Maternal Grandmother     Social History:  reports that she has never smoked. She has been exposed to tobacco smoke. She has never used smokeless tobacco. She reports that she does not drink alcohol and does not use drugs.  ALLERGIES/MEDS:  Allergies: No Known Allergies  No medications prior to admission.     ROS  Height 5\' 4"  (1.626 m), weight 64.9 kg, last menstrual period 10/02/2023. Physical Exam CONSTITUTIONAL: alert, oriented, NAD.         SKIN: normal, no rash.         HEENT: normal.         HEAD: Normocephalic.         EYES: EOMI.         LUNGS: clear to auscultation bilaterally, no wheezes, rhonchi, rales.         HEART: no  murmurs, regular rate and rhythm.         ABDOMEN: no hepatosplenomegaly, no masses palpated, soft and not tender.         FEMALE GENITOURINARY: deferred.         EXTREMITIES: normal range of motion.         NEUROLOGIC EXAM: alert and oriented x 3.  No results found for this or any previous visit (from the past 24 hours).  No results found.   ASSESSMENT/PLAN: Menorrhagia with regular cycles Chronic. -Desires IUD placement for menorrhagia.  -Discussed possible increase in pelvic cramping and increased heaviness of menses. Also discuss risk of pregnancy is low with IUD but encouraged to follow up if missed period due to increased risk of ectopic pregnancy if conception happens with IUD. Reasons to follow up given. She voiced understanding.  -IUD removal and insertion of mirena IUD under anesthesia due to HMB and inability to tolerate office pelvic exam.  -The risks of surgery were discussed with the patient including but were not limited to: bleeding which may require transfusion; infection, injury to uterus or other surrounding organs; need for additional procedures including possible hysteroscopy w/ D&C if needed. Written and verbal consent given.   -To OR when ready. Surgery scheduled for 10am. Orders placed. Dr. Wynona Hedger is GYN back up in case hysteroscopy w/  D&C is needed.   Thana Filter, DO [facility] 10/20/2023, 7:26 AM

## 2023-10-20 NOTE — Op Note (Addendum)
 PROCEDURE NOTE: IUD REMOVAL AND INSERTION with collection of pap smear  PROCEDURE DATE: 10/20/2023  PREOPERATIVE DIAGNOSES: Menorrhagia, IDA, IUD in place  POSTOPERATIVE DIAGNOSES: The same  SURGEON:  Dr. Jeneane Miracle Autry-Lott  ANESTHESIOLOGY TEAM: Anesthesiologist: Grace Laura, MD CRNA: Ballard Levels, CRNA  INDICATIONS: Melanie Lam is a 23 y.o. with a history of menorrhagia with regular cycle, anemia due to chronic blood loss due to menses, current expired IUD, need for cervical cancer screening.   FINDINGS:    ANESTHESIA: Sedation/MAC INTRAVENOUS FLUIDS: 600 ml   ESTIMATED BLOOD LOSS: minimal, 10 mL SPECIMENS: Pap, cytology COMPLICATIONS: None immediate  PROCEDURE IN DETAIL  IUD REMOVAL: Patient given informed consent for IUD removal and re-insertion of new IUD. Signed copy in the chart. Appropriate time out taken. The pap smear was collected prior to iud removal. Sterile technique used thereafter. Patient placed in the lithotomy position and the cervix brought into view using speculum. The IUD strings were identified coming from the cervical os. These strings were grasped with ring forceps, and the IUD withdrawn gently from the uterus. There were no complications and no blood loss.    IUD INSERTION: Sterile technique maintained. A tenaculum was placed into the posterior lip of the cervix and a uterine sound was used to measure uterine size to 6cm.  A Mirena IUD was placed into the endometrial cavity, deployed and secured. The applicator was removed. The strings were trimmed to 3 centimeters.  All equipment was removed and accounted for. Pressure and silver nitrate used to achieve hemostasis. There were no complications and the patient tolerated the procedure well.   Jeneane Miracle Autry-Lott, DO [facility] 10/20/2023, 11:23 AM

## 2023-10-20 NOTE — Discharge Instructions (Addendum)
     Post Anesthesia Home Care Instructions  Activity: Get plenty of rest for the remainder of the day. A responsible individual must stay with you for 24 hours following the procedure.  For the next 24 hours, DO NOT: -Drive a car -Advertising copywriter -Drink alcoholic beverages -Take any medication unless instructed by your physician -Make any legal decisions or sign important papers.  Meals: Start with liquid foods such as gelatin or soup. Progress to regular foods as tolerated. Avoid greasy, spicy, heavy foods. If nausea and/or vomiting occur, drink only clear liquids until the nausea and/or vomiting subsides. Call your physician if vomiting continues.  Special Instructions/Symptoms: Your throat may feel dry or sore from the anesthesia or the breathing tube placed in your throat during surgery. If this causes discomfort, gargle with warm salt water. The discomfort should disappear within 24 hours.  If you had a scopolamine patch placed behind your ear for the management of post- operative nausea and/or vomiting:  1. The medication in the patch is effective for 72 hours, after which it should be removed.  Wrap patch in a tissue and discard in the trash. Wash hands thoroughly with soap and water. 2. You may remove the patch earlier than 72 hours if you experience unpleasant side effects which may include dry mouth, dizziness or visual disturbances. 3. Avoid touching the patch. Wash your hands with soap and water after contact with the patch.  If needed, do not take any Tylenol until after 3:05 pm today.

## 2023-10-21 ENCOUNTER — Encounter (HOSPITAL_COMMUNITY): Payer: Self-pay | Admitting: Family Medicine

## 2023-10-21 NOTE — Anesthesia Postprocedure Evaluation (Signed)
 Anesthesia Post Note  Patient: Melanie Lam  Procedure(s) Performed: REMOVAL, INTRAUTERINE DEVICE INSERTION, INTRAUTERINE DEVICE/ PAP SMEAR     Patient location during evaluation: PACU Anesthesia Type: MAC Level of consciousness: awake and alert Pain management: pain level controlled Vital Signs Assessment: post-procedure vital signs reviewed and stable Respiratory status: spontaneous breathing, nonlabored ventilation, respiratory function stable and patient connected to nasal cannula oxygen Cardiovascular status: stable and blood pressure returned to baseline Postop Assessment: no apparent nausea or vomiting Anesthetic complications: no  No notable events documented.  Last Vitals:  Vitals:   10/20/23 1115 10/20/23 1130  BP: 113/71 93/66  Pulse: 67 65  Resp: 15 18  Temp:  36.6 C  SpO2: 100% 98%    Last Pain:  Vitals:   10/20/23 1130  TempSrc:   PainSc: 0-No pain   Pain Goal: Patients Stated Pain Goal: 5 (10/20/23 1130)                 Abdiaziz Klahn L Abrian [place]

## 2023-10-22 LAB — CYTOLOGY - PAP: Diagnosis: NEGATIVE

## 2023-10-26 NOTE — Addendum Note (Signed)
 Addendum  created 10/26/23 0840 by Grace Laura, MD   Attestation recorded in Intraprocedure, Flowsheet accepted, Intraprocedure Attestations filed

## 2023-10-30 ENCOUNTER — Encounter: Payer: Self-pay | Admitting: Family Medicine

## 2023-11-15 ENCOUNTER — Ambulatory Visit
Admission: EM | Admit: 2023-11-15 | Discharge: 2023-11-15 | Disposition: A | Discharge status: Home / Self Care | Attending: Family Medicine | Admitting: Family Medicine

## 2023-11-15 DIAGNOSIS — J069 Acute upper respiratory infection, unspecified: Secondary | ICD-10-CM

## 2023-11-15 LAB — POC COVID19/FLU A&B COMBO
Covid Antigen, POC: NEGATIVE
Influenza A Antigen, POC: NEGATIVE
Influenza B Antigen, POC: NEGATIVE

## 2023-11-15 MED ORDER — PROMETHAZINE-DM 6.25-15 MG/5ML PO SYRP: 5.0000 mL | ORAL_SOLUTION | Freq: Four times a day (QID) | ORAL | 0 refills | Status: DC | PRN

## 2023-11-15 MED ORDER — CYCLOBENZAPRINE HCL 5 MG PO TABS: 5.0000 mg | ORAL_TABLET | Freq: Two times a day (BID) | ORAL | 0 refills | Status: AC | PRN

## 2023-11-15 NOTE — Discharge Instructions (Signed)
 You tested negative for COVID and flu.  You may take Promethazine  DM as needed for your cough.  Please note this medication will make you drowsy.  Do not drink alcohol or drive on this medication.  May take Flexeril twice daily as needed for muscle pain.  Do not take this with your cough syrup due to risk of oversedation.  Muscle relaxers can also make you tired.  Lots of rest and fluids and follow-up with your PCP in 2 to 3 days for recheck.  Please go to the ER for any worsening symptoms.  Hope you feel better soon!

## 2023-11-15 NOTE — ED Provider Notes (Signed)
 [facility]    CSN: [id number] Arrival date & time: 11/15/23  1037      History   Chief Complaint Chief Complaint  Patient presents with   Cough    HPI Melanie Lam is a 23 y.o. female  presents for evaluation of URI symptoms for 3-5 days. Patient reports associated symptoms of cough, congestion, chills, diarrhea.  Had a sore throat that is now resolved.  Denies N/V, ear pain, body aches, shortness of breath. Patient does not have a hx of asthma. Patient is not an active smoker.   Reports no sick contacts.  Pt has taken cold medicine OTC for symptoms. Pt has no other concerns at this time.    Cough Associated symptoms: chills     Past Medical History:  Diagnosis Date   Hearing loss in right ear    Medical history non-contributory     Patient Active Problem List   Diagnosis Date Noted   Menorrhagia with regular cycle 10/17/2015   Iron deficiency anemia 10/17/2015   Family history of thrombosis in first degree relative 10/17/2015    Past Surgical History:  Procedure Laterality Date   INTRAUTERINE DEVICE (IUD) INSERTION     5th or 6th grade   INTRAUTERINE DEVICE (IUD) INSERTION N/A 10/20/2023   Procedure: INSERTION, INTRAUTERINE DEVICE/ PAP SMEAR;  Surgeon: Thana Filter, DO;  Location: [facility] OR;  Service: Gynecology;  Laterality: N/A;   IUD REMOVAL N/A 10/20/2023   Procedure: REMOVAL, INTRAUTERINE DEVICE;  Surgeon: Thana Filter, DO;  Location: [facility] OR;  Service: Gynecology;  Laterality: N/A;    OB History   No obstetric history on file.      Home Medications    Prior to Admission medications   Medication Sig Start Date End Date Taking? Authorizing Provider  cyclobenzaprine (FLEXERIL) 5 MG tablet Take 1 tablet (5 mg total) by mouth 2 (two) times daily as needed for muscle spasms. 11/15/23  Yes Chadwin Fury, Jodi R, NP  promethazine -dextromethorphan (PROMETHAZINE -DM) 6.25-15 MG/5ML syrup Take 5 mLs by mouth 4 (four) times daily as needed for cough. 11/15/23   Yes Shawan Tosh, Jodi R, NP  amoxicillin  (AMOXIL ) 875 MG tablet Take 1 tablet (875 mg total) by mouth 2 (two) times daily. Patient not taking: Reported on 10/20/2023 07/09/23   Adolph Hoop, PA-C  cetirizine  (ZYRTEC  ALLERGY) 10 MG tablet Take 1 tablet (10 mg total) by mouth daily. 07/09/23   Adolph Hoop, PA-C  diphenhydrAMINE  (BENADRYL ) 25 MG tablet Take 1 tablet (25 mg total) by mouth every 6 (six) hours for 5 days. 06/16/21 10/20/23  Lindle Rhea, MD  famotidine  (PEPCID ) 20 MG tablet Take 1 tablet (20 mg total) by mouth daily for 5 days. 06/16/21 06/21/21  Lindle Rhea, MD  ibuprofen  (ADVIL ) 800 MG tablet Take 1 tablet (800 mg total) by mouth every 8 (eight) hours as needed. 10/20/23   Autry-Lott, Jeneane Miracle, DO  levonorgestrel  (MIRENA ) 20 MCG/24HR IUD 1 each by Intrauterine route once.    [provider]  pseudoephedrine  (SUDAFED) 60 MG tablet Take 1 tablet (60 mg total) by mouth every 8 (eight) hours as needed for congestion. 07/09/23   Adolph Hoop, PA-C    Family History Family History  Problem Relation Age of Onset   Stroke Mother    Stroke Paternal Aunt    Pulmonary embolism Maternal Grandmother     Social History Social History   Tobacco Use   Smoking status: Some Days    Types: Cigars    Passive exposure: Yes  Smokeless tobacco: Never   Tobacco comments:    mom in the home; black and mild every other day  Vaping Use   Vaping status: Never Used  Substance Use Topics   Alcohol use: Never    Alcohol/week: 0.0 standard drinks of alcohol   Drug use: Never     Allergies   Patient has no known allergies.   Review of Systems Review of Systems  Constitutional:  Positive for chills.  HENT:  Positive for congestion.   Respiratory:  Positive for cough.      Physical Exam Triage Vital Signs ED Triage Vitals  Encounter Vitals Group     BP 11/15/23 1203 106/69     Systolic BP Percentile --      Diastolic BP Percentile --      Pulse Rate 11/15/23 1203 66      Resp 11/15/23 1203 17     Temp 11/15/23 1203 99.1 F (37.3 C)     Temp Source 11/15/23 1203 Oral     SpO2 11/15/23 1203 99 %     Weight --      Height --      Head Circumference --      Peak Flow --      Pain Score 11/15/23 1201 4     Pain Loc --      Pain Education --      Exclude from Growth Chart --    No data found.  Updated Vital Signs BP 106/69 (BP Location: Right Arm)   Pulse 66   Temp 99.1 F (37.3 C) (Oral)   Resp 17   LMP 10/22/2023 (Exact Date)   SpO2 99%   Visual Acuity Right Eye Distance:   Left Eye Distance:   Bilateral Distance:    Right Eye Near:   Left Eye Near:    Bilateral Near:     Physical Exam Vitals and nursing note reviewed.  Constitutional:      General: She is not in acute distress.    Appearance: She is well-developed. She is not ill-appearing.  HENT:     Head: Normocephalic and atraumatic.     Right Ear: Tympanic membrane and ear canal normal.     Left Ear: Tympanic membrane and ear canal normal.     Nose: Congestion present.     Mouth/Throat:     Mouth: Mucous membranes are moist.     Pharynx: Oropharynx is clear. Uvula midline. No posterior oropharyngeal erythema.     Tonsils: No tonsillar exudate or tonsillar abscesses.  Eyes:     Conjunctiva/sclera: Conjunctivae normal.     Pupils: Pupils are equal, round, and reactive to light.  Cardiovascular:     Rate and Rhythm: Normal rate and regular rhythm.     Heart sounds: Normal heart sounds.  Pulmonary:     Effort: Pulmonary effort is normal.     Breath sounds: Normal breath sounds. No wheezing, rhonchi or rales.  Musculoskeletal:     Cervical back: Normal range of motion and neck supple.  Lymphadenopathy:     Cervical: No cervical adenopathy.  Skin:    General: Skin is warm and dry.  Neurological:     General: No focal deficit present.     Mental Status: She is alert and oriented to person, place, and time.  Psychiatric:        Mood and Affect: Mood normal.         Behavior: Behavior normal.      UC Treatments /  Results  Labs (all labs ordered are listed, but only abnormal results are displayed) Labs Reviewed  POC COVID19/FLU A&B COMBO    EKG   Radiology No results found.  Procedures Procedures (including critical care time)  Medications Ordered in UC Medications - No data to display  Initial Impression / Assessment and Plan / UC Course  I have reviewed the triage vital signs and the nursing notes.  Pertinent labs & imaging results that were available during my care of the patient were reviewed by me and considered in my medical decision making (see chart for details).     Reviewed exam and symptoms with patient.  No red flags.  Negative rapid flu and COVID testing.  Discussed viral illness and symptomatic treatment.  Promethazine  DM as needed for cough, side effect profile reviewed.  Patient requested something for body aches/muscle pain, Flexeril twice daily as needed.  Was not to take the Flexeril or the cough syrup together or within 8 hours of each other.  Discussed rest fluids and PCP follow-up 2 to 3 days for recheck.  ER precautions reviewed and patient verbalized understanding. Final Clinical Impressions(s) / UC Diagnoses   Final diagnoses:  Viral upper respiratory illness     Discharge Instructions      You tested negative for COVID and flu.  You may take Promethazine  DM as needed for your cough.  Please note this medication will make you drowsy.  Do not drink alcohol or drive on this medication.  May take Flexeril twice daily as needed for muscle pain.  Do not take this with your cough syrup due to risk of oversedation.  Muscle relaxers can also make you tired.  Lots of rest and fluids and follow-up with your PCP in 2 to 3 days for recheck.  Please go to the ER for any worsening symptoms.  Hope you feel better soon!   ED Prescriptions     Medication Sig Dispense Auth. Provider   promethazine -dextromethorphan  (PROMETHAZINE -DM) 6.25-15 MG/5ML syrup Take 5 mLs by mouth 4 (four) times daily as needed for cough. 118 mL Trany Chernick, Jodi R, NP   cyclobenzaprine (FLEXERIL) 5 MG tablet Take 1 tablet (5 mg total) by mouth 2 (two) times daily as needed for muscle spasms. 10 tablet Deazia Lampi, Jodi R, NP      PDMP not reviewed this encounter.   Alleen Arbour, NP 11/15/23 1301

## 2023-11-15 NOTE — ED Triage Notes (Signed)
 Pt present with c/o cough, chest congestion and sore throat x three days. Reports she recently had went to the beach, has been feeling hot then cold. States when she coughs up mucus it is brown and green. C/o bilateral ear pain.

## 2023-11-22 ENCOUNTER — Other Ambulatory Visit: Payer: Self-pay

## 2023-11-22 ENCOUNTER — Emergency Department (HOSPITAL_COMMUNITY)
Admission: EM | Admit: 2023-11-22 | Discharge: 2023-11-22 | Disposition: A | Discharge status: Home / Self Care | Attending: Emergency Medicine | Admitting: Emergency Medicine

## 2023-11-22 ENCOUNTER — Encounter (HOSPITAL_COMMUNITY): Payer: Self-pay | Admitting: *Deleted

## 2023-11-22 ENCOUNTER — Emergency Department (HOSPITAL_COMMUNITY)

## 2023-11-22 DIAGNOSIS — F17200 Nicotine dependence, unspecified, uncomplicated: Secondary | ICD-10-CM | POA: Diagnosis not present

## 2023-11-22 DIAGNOSIS — J069 Acute upper respiratory infection, unspecified: Secondary | ICD-10-CM | POA: Insufficient documentation

## 2023-11-22 DIAGNOSIS — R059 Cough, unspecified: Secondary | ICD-10-CM | POA: Diagnosis not present

## 2023-11-22 DIAGNOSIS — R0602 Shortness of breath: Secondary | ICD-10-CM | POA: Diagnosis not present

## 2023-11-22 LAB — CBC WITH DIFFERENTIAL/PLATELET
Abs Immature Granulocytes: 0.02 10*3/uL (ref 0.00–0.07)
Basophils Absolute: 0 10*3/uL (ref 0.0–0.1)
Basophils Relative: 0 %
Eosinophils Absolute: 0.1 10*3/uL (ref 0.0–0.5)
Eosinophils Relative: 1 %
HCT: 35.1 % — ABNORMAL LOW (ref 36.0–46.0)
Hemoglobin: 11.3 g/dL — ABNORMAL LOW (ref 12.0–15.0)
Immature Granulocytes: 0 %
Lymphocytes Relative: 12 %
Lymphs Abs: 1.3 10*3/uL (ref 0.7–4.0)
MCH: 28.3 pg (ref 26.0–34.0)
MCHC: 32.2 g/dL (ref 30.0–36.0)
MCV: 87.8 fL (ref 80.0–100.0)
Monocytes Absolute: 1.4 10*3/uL — ABNORMAL HIGH (ref 0.1–1.0)
Monocytes Relative: 14 %
Neutro Abs: 7.7 10*3/uL (ref 1.7–7.7)
Neutrophils Relative %: 73 %
Platelets: 231 10*3/uL (ref 150–400)
RBC: 4 MIL/uL (ref 3.87–5.11)
RDW: 14.3 % (ref 11.5–15.5)
WBC: 10.6 10*3/uL — ABNORMAL HIGH (ref 4.0–10.5)
nRBC: 0 % (ref 0.0–0.2)

## 2023-11-22 LAB — BASIC METABOLIC PANEL WITH GFR
Anion gap: 9 (ref 5–15)
BUN: 7 mg/dL (ref 6–20)
CO2: 22 mmol/L (ref 22–32)
Calcium: 9.6 mg/dL (ref 8.9–10.3)
Chloride: 108 mmol/L (ref 98–111)
Creatinine, Ser: 0.86 mg/dL (ref 0.44–1.00)
GFR, Estimated: >60 mL/min (ref >60)
Glucose, Bld: 82 mg/dL (ref 70–99)
Potassium: 3.3 mmol/L — ABNORMAL LOW (ref 3.5–5.1)
Sodium: 139 mmol/L (ref 135–145)

## 2023-11-22 LAB — RESP PANEL BY RT-PCR (RSV, FLU A&B, COVID)  RVPGX2
Influenza A by PCR: NEGATIVE
Influenza B by PCR: NEGATIVE
Resp Syncytial Virus by PCR: NEGATIVE
SARS Coronavirus 2 by RT PCR: NEGATIVE

## 2023-11-22 LAB — GROUP A STREP BY PCR: Group A Strep by PCR: NOT DETECTED

## 2023-11-22 MED ORDER — BENZONATATE 100 MG PO CAPS
100.0000 mg | ORAL_CAPSULE | Freq: Once | ORAL | Status: AC
  Administered 2023-11-22: 100 mg via ORAL
  Filled 2023-11-22: qty 1

## 2023-11-22 MED ORDER — ALBUTEROL SULFATE (2.5 MG/3ML) 0.083% IN NEBU: 2.5000 mg | INHALATION_SOLUTION | Freq: Once | RESPIRATORY_TRACT | Status: DC

## 2023-11-22 MED ORDER — ALBUTEROL SULFATE HFA 108 (90 BASE) MCG/ACT IN AERS: 1.0000 | INHALATION_SPRAY | Freq: Four times a day (QID) | RESPIRATORY_TRACT | 0 refills | Status: AC | PRN

## 2023-11-22 MED ORDER — ALBUTEROL SULFATE HFA 108 (90 BASE) MCG/ACT IN AERS
2.0000 | INHALATION_SPRAY | Freq: Once | RESPIRATORY_TRACT | Status: AC
  Administered 2023-11-22: 2 via RESPIRATORY_TRACT
  Filled 2023-11-22: qty 6.7

## 2023-11-22 MED ORDER — BENZONATATE 100 MG PO CAPS: 100.0000 mg | ORAL_CAPSULE | Freq: Three times a day (TID) | ORAL | 0 refills | Status: DC

## 2023-11-22 NOTE — ED Triage Notes (Signed)
 Patient was seen at [facility] on Monday and given decongestants and cough medicine , stastes she congestion and cough is getting worse, states she got dizzy and fell last pm, states she woke up this am was still dizzy and increased sob this am.

## 2023-11-22 NOTE — ED Provider Notes (Signed)
 [place] EMERGENCY DEPARTMENT AT [facility] Provider Note   CSN: [id number] Arrival date & time: 11/22/23  2440     History  Chief Complaint  Patient presents with   Cough    Melanie Lam is a 23 y.o. female.  Who presents today for concerns over a cough, nasal congestion, and shortness of breath that she states has not improved since she was seen 1 week prior at urgent care.  She was prescribed cough medication as well as a muscle relaxer for generalized muscle aches, states that she does not have any more cough medication as this was spilled so she has been using over-the-counter cough and cold remedies.  She also states that she is dizzy especially with standing.  Further she does state that she has increased tenderness in her neck in the region of her lymph nodes.  Her primary concern and reason for presenting today is an increase in symptoms since her initial presentation and failure for symptoms to resolve.  He does endorse a smoking history stating she occasionally smokes Black and milds but has not smoked and in the last 48 to 72 hours.  States she has not smoked due to increasing shortness of breath and cough.   Cough Associated symptoms: no ear pain and no fever        Home Medications Prior to Admission medications   Medication Sig Start Date End Date Taking? Authorizing Provider  albuterol  (VENTOLIN  HFA) 108 (90 Base) MCG/ACT inhaler Inhale 1-2 puffs into the lungs every 6 (six) hours as needed for wheezing or shortness of breath. 11/22/23  Yes Juanetta Nordmann, PA  benzonatate  (TESSALON ) 100 MG capsule Take 1 capsule (100 mg total) by mouth every 8 (eight) hours. 11/22/23  Yes Juanetta Nordmann, PA  amoxicillin  (AMOXIL ) 875 MG tablet Take 1 tablet (875 mg total) by mouth 2 (two) times daily. Patient not taking: Reported on 10/20/2023 07/09/23   Adolph Hoop, PA-C  cetirizine  (ZYRTEC  ALLERGY) 10 MG tablet Take 1 tablet (10 mg total) by mouth daily. 07/09/23    Adolph Hoop, PA-C  cyclobenzaprine  (FLEXERIL ) 5 MG tablet Take 1 tablet (5 mg total) by mouth 2 (two) times daily as needed for muscle spasms. 11/15/23   Mayer, Jodi R, NP  diphenhydrAMINE  (BENADRYL ) 25 MG tablet Take 1 tablet (25 mg total) by mouth every 6 (six) hours for 5 days. 06/16/21 10/20/23  Lindle Rhea, MD  famotidine  (PEPCID ) 20 MG tablet Take 1 tablet (20 mg total) by mouth daily for 5 days. 06/16/21 06/21/21  Lindle Rhea, MD  ibuprofen  (ADVIL ) 800 MG tablet Take 1 tablet (800 mg total) by mouth every 8 (eight) hours as needed. 10/20/23   Autry-Lott, Jeneane Miracle, DO  levonorgestrel  (MIRENA ) 20 MCG/24HR IUD 1 each by Intrauterine route once.    [provider]  promethazine -dextromethorphan (PROMETHAZINE -DM) 6.25-15 MG/5ML syrup Take 5 mLs by mouth 4 (four) times daily as needed for cough. 11/15/23   Mayer, Jodi R, NP  pseudoephedrine  (SUDAFED) 60 MG tablet Take 1 tablet (60 mg total) by mouth every 8 (eight) hours as needed for congestion. 07/09/23   Adolph Hoop, PA-C      Allergies    Patient has no known allergies.    Review of Systems   Review of Systems  Constitutional:  Positive for appetite change and fatigue. Negative for fever.       Decreased appetite  HENT:  Positive for congestion and postnasal drip. Negative for ear discharge, ear pain,  facial swelling and trouble swallowing.   Respiratory:  Positive for cough.   Hematological:  Positive for adenopathy.    Physical Exam Updated Vital Signs BP 106/78 (BP Location: Right Arm)   Pulse 76   Temp 97.7 F (36.5 C) (Oral)   Resp 18   Ht 5\' 4"  (1.626 m)   Wt 61.2 kg   LMP 10/22/2023 (Exact Date)   SpO2 100%   BMI 23.17 kg/m  Physical Exam Vitals and nursing note reviewed.  Constitutional:      General: She is not in acute distress.    Appearance: Normal appearance.  HENT:     Head: Normocephalic and atraumatic.     Mouth/Throat:     Mouth: Mucous membranes are moist.     Pharynx:  Oropharynx is clear.  Eyes:     Extraocular Movements: Extraocular movements intact.     Conjunctiva/sclera: Conjunctivae normal.     Pupils: Pupils are equal, round, and reactive to light.  Cardiovascular:     Rate and Rhythm: Normal rate and regular rhythm.     Pulses: Normal pulses.     Heart sounds: Normal heart sounds. No murmur heard.    No friction rub. No gallop.  Pulmonary:     Effort: Pulmonary effort is normal.     Breath sounds: Normal breath sounds.  Abdominal:     General: Abdomen is flat. Bowel sounds are normal.     Palpations: Abdomen is soft.  Musculoskeletal:        General: Normal range of motion.     Cervical back: Normal range of motion and neck supple.     Right lower leg: No edema.     Left lower leg: No edema.  Lymphadenopathy:     Cervical: Cervical adenopathy present.     Right cervical: No superficial, deep or posterior cervical adenopathy.    Left cervical: Superficial cervical adenopathy present. No posterior cervical adenopathy.  Skin:    General: Skin is warm and dry.     Capillary Refill: Capillary refill takes less than 2 seconds.  Neurological:     General: No focal deficit present.     Mental Status: She is alert and oriented to person, place, and time. Mental status is at baseline.     GCS: GCS eye subscore is 4. GCS verbal subscore is 5. GCS motor subscore is 6.  Psychiatric:        Attention and Perception: Attention and perception normal.        Mood and Affect: Mood normal.        Speech: Speech normal.        Behavior: Behavior normal.        Thought Content: Thought content normal.     ED Results / Procedures / Treatments   Labs (all labs ordered are listed, but only abnormal results are displayed) Labs Reviewed  BASIC METABOLIC PANEL WITH GFR - Abnormal; Notable for the following components:      Result Value   Potassium 3.3 (*)    All other components within normal limits  CBC WITH DIFFERENTIAL/PLATELET - Abnormal; Notable  for the following components:   WBC 10.6 (*)    Hemoglobin 11.3 (*)    HCT 35.1 (*)    Monocytes Absolute 1.4 (*)    All other components within normal limits  GROUP A STREP BY PCR  RESP PANEL BY RT-PCR (RSV, FLU A&B, COVID)  [id number]    EKG None  Radiology DG Chest 2  View Result Date: 11/22/2023 CLINICAL DATA:  Cough and shortness of breath EXAM: CHEST - 2 VIEW COMPARISON:  X-ray 10/26/2019 FINDINGS: No consolidation, pneumothorax or effusion. No edema. Normal cardiopericardial silhouette. IMPRESSION: No acute cardiopulmonary disease. Electronically Signed   By: Adrianna Horde M.D.   On: 11/22/2023 13:12    Procedures Procedures    Medications Ordered in ED Medications  benzonatate  (TESSALON ) capsule 100 mg (100 mg Oral Given 11/22/23 1329)  albuterol  (VENTOLIN  HFA) 108 (90 Base) MCG/ACT inhaler 2 puff (2 puffs Inhalation Given 11/22/23 1329)    ED Course/ Medical Decision Making/ A&P                                 Medical Decision Making Amount and/or Complexity of Data Reviewed Labs: ordered. Radiology: ordered.  Risk Prescription drug management.   Medical Decision Making:   Melanie Lam is a 23 y.o. female who presented to the ED today with increased cough along with subjective shortness of breath detailed above.     Complete initial physical exam performed, notably the patient  was alert and oriented and in no apparent distress but obviously uncomfortable.   Good air entry is noted to all lung fields without any wheezes, rales, or rhonchi though coarse bronchovesicular sounds are noted.  She does have tender lymphadenopathy to the left submandibular.  She has a normal-appearing abdomen though endorses diffuse tenderness with palpation.  No hepatosplenomegaly is noted.  Reviewed and confirmed nursing documentation for past medical history, family history, social history.    Initial Assessment:   With the patient's presentation of cough, most likely diagnosis is viral  syndrome. Other diagnoses were considered including (but not limited to) pneumonia. These are considered less likely due to history of present illness and physical exam findings.     Initial Plan:  Obtain swabs for COVID/RSV/flu as well as for strep due to presentation of sore throat. Screening labs including CBC and Metabolic panel to evaluate for infectious or metabolic etiology of disease.  CXR to evaluate for structural/infectious intrathoracic pathology.  EKG to evaluate for cardiac pathology Objective evaluation as below reviewed   Initial Study Results:   Laboratory  All laboratory results reviewed without evidence of clinically relevant pathology.   Exceptions include: Mildly elevated white count of 10.6, hemoglobin is mildly decreased 11.3.  Review of previous records does show that this hemoglobin may possibly be at or near baseline.  EKG EKG was reviewed independently. Rate, rhythm, axis, intervals all examined and without medically relevant abnormality. ST segments without concerns for elevations.    Radiology:  All images reviewed independently. Agree with radiology report at this time.   DG Chest 2 View Result Date: 11/22/2023 CLINICAL DATA:  Cough and shortness of breath EXAM: CHEST - 2 VIEW COMPARISON:  X-ray 10/26/2019 FINDINGS: No consolidation, pneumothorax or effusion. No edema. Normal cardiopericardial silhouette. IMPRESSION: No acute cardiopulmonary disease. Electronically Signed   By: Adrianna Horde M.D.   On: 11/22/2023 13:12      Reassessment and Plan:   Negative x-ray findings along with benign physical exam and along with her history of present illness, likely illness continuing to be result of a upper respiratory viral infection.    If the manage cough with nebulized albuterol  here in the emergency department.  As she continues to have cough that is nonresponsive to previous cough therapy, we will prescribe an albuterol  inhaler to manage cough along with oral  Tessalon .  Will have patient continue to use oral Tylenol  and NSAIDs for management of pain.  Further encourage increased oral intake.  Discussed with patient to follow-up with outpatient family practice/primary care.          Final Clinical Impression(s) / ED Diagnoses Final diagnoses:  Viral URI with cough    Rx / DC Orders ED Discharge Orders          Ordered    benzonatate  (TESSALON ) 100 MG capsule  Every 8 hours        11/22/23 1305    albuterol  (VENTOLIN  HFA) 108 (90 Base) MCG/ACT inhaler  Every 6 hours PRN        11/22/23 1305              Juanetta Nordmann, PA 11/22/23 1335    Quinn Bucco, DO 12/06/23 2106

## 2024-01-19 ENCOUNTER — Other Ambulatory Visit: Payer: Self-pay

## 2024-01-19 ENCOUNTER — Ambulatory Visit (INDEPENDENT_AMBULATORY_CARE_PROVIDER_SITE_OTHER)

## 2024-01-19 ENCOUNTER — Ambulatory Visit
Admission: EM | Admit: 2024-01-19 | Discharge: 2024-01-19 | Disposition: A | Discharge status: Home / Self Care | Attending: Nurse Practitioner | Admitting: Nurse Practitioner

## 2024-01-19 DIAGNOSIS — S93602A Unspecified sprain of left foot, initial encounter: Secondary | ICD-10-CM | POA: Diagnosis not present

## 2024-01-19 DIAGNOSIS — M79672 Pain in left foot: Secondary | ICD-10-CM

## 2024-01-19 NOTE — ED Triage Notes (Addendum)
 Pt was playing basketball at midnight and injured left foot. Pt jumped up and landed on left foot and it pushed against the wall and states, my foot turned. Pt states pain radiates from foot up to left lower leg. Pt has 1+ swelling of left foot. Pt has 2+ left pedal pulse, cap refill less than 3 sec, warm to touch, able to wiggle toes. Pt had to be brought to exam room in [facility]

## 2024-01-19 NOTE — Discharge Instructions (Addendum)
 You have been evaluated for left foot pain following a basketball injury. X-rays of your foot and ankle were negative for any fracture, dislocation, or bony abnormality, and your injury is most consistent with a sprain. You may treat this at home with over-the-counter anti-inflammatory medications such as ibuprofen  or naproxen to help manage pain and swelling. Use a supportive walking boot and limit activity as needed, but you may bear weight on the foot as tolerated. Apply ice to the injured area for 15 to 20 minutes at a time, several times a day, and keep the foot elevated when resting to reduce swelling. Avoid high-impact activities or sports until the pain has significantly improved. If your symptoms do not improve within 7 to 10 days, follow up with an orthopedic specialist for further evaluation. Seek immediate medical attention if you develop severe or worsening pain, increased swelling, inability to move your toes, or signs of poor circulation such as numbness, coolness, or discoloration of the foot.

## 2024-01-19 NOTE — ED Provider Notes (Signed)
 [facility]    CSN: [id number] Arrival date & time: 01/19/24  0934      History   Chief Complaint No chief complaint on file.   HPI Melanie Lam is a 23 y.o. female.   Discussed the use of AI scribe software for clinical note transcription with the patient, who gave verbal consent to proceed.   The patient presents with foot pain following a basketball injury that occurred last night at midnight. The patient reports landing awkwardly while playing basketball, resulting in immediate pain and swelling in the left foot. The pain is described as most severe in the foot but has gradually migrated up the leg. The patient notes tingling and throbbing sensations in the foot. The injury has impacted the patient's ability to walk, causing pain with ambulation. The patient attempted to manage the symptoms by elevating the leg and taking Tylenol , but the pain persists. The foot was initially swollen following the injury. The patient can wiggle her toes but experiences pain in the ankle area.  The following portions of the patient's history were reviewed and updated as appropriate: allergies, current medications, past family history, past medical history, past social history, past surgical history, and problem list.      Past Medical History:  Diagnosis Date   Hearing loss in right ear    Medical history non-contributory     Patient Active Problem List   Diagnosis Date Noted   Menorrhagia with regular cycle 10/17/2015   Iron deficiency anemia 10/17/2015   Family history of thrombosis in first degree relative 10/17/2015    Past Surgical History:  Procedure Laterality Date   INTRAUTERINE DEVICE (IUD) INSERTION     5th or 6th grade   INTRAUTERINE DEVICE (IUD) INSERTION N/A 10/20/2023   Procedure: INSERTION, INTRAUTERINE DEVICE/ PAP SMEAR;  Surgeon: Letha Savant, DO;  Location: [facility] OR;  Service: Gynecology;  Laterality: N/A;   IUD REMOVAL N/A 10/20/2023   Procedure:  REMOVAL, INTRAUTERINE DEVICE;  Surgeon: Letha Savant, DO;  Location: [facility] OR;  Service: Gynecology;  Laterality: N/A;    OB History   No obstetric history on file.      Home Medications    Prior to Admission medications   Medication Sig Start Date End Date Taking? Authorizing Provider  albuterol  (VENTOLIN  HFA) 108 (90 Base) MCG/ACT inhaler Inhale 1-2 puffs into the lungs every 6 (six) hours as needed for wheezing or shortness of breath. 11/22/23   Myriam Dorn BROCKS, PA  cetirizine  (ZYRTEC  ALLERGY) 10 MG tablet Take 1 tablet (10 mg total) by mouth daily. 07/09/23   Christopher Savannah, PA-C  cyclobenzaprine  (FLEXERIL ) 5 MG tablet Take 1 tablet (5 mg total) by mouth 2 (two) times daily as needed for muscle spasms. 11/15/23   Mayer, Jodi R, NP  diphenhydrAMINE  (BENADRYL ) 25 MG tablet Take 1 tablet (25 mg total) by mouth every 6 (six) hours for 5 days. 06/16/21 10/20/23  Trine Raynell Moder, MD  famotidine  (PEPCID ) 20 MG tablet Take 1 tablet (20 mg total) by mouth daily for 5 days. 06/16/21 06/21/21  Trine Raynell Moder, MD  ibuprofen  (ADVIL ) 800 MG tablet Take 1 tablet (800 mg total) by mouth every 8 (eight) hours as needed. 10/20/23   Autry-Lott, Savant, DO  levonorgestrel  (MIRENA ) 20 MCG/24HR IUD 1 each by Intrauterine route once.    [provider]  pseudoephedrine  (SUDAFED) 60 MG tablet Take 1 tablet (60 mg total) by mouth every 8 (eight) hours as needed for congestion. 07/09/23   Christopher Savannah,  PA-C    Family History Family History  Problem Relation Age of Onset   Stroke Mother    Stroke Paternal Aunt    Pulmonary embolism Maternal Grandmother     Social History Social History   Tobacco Use   Smoking status: Some Days    Types: Cigars    Passive exposure: Yes   Smokeless tobacco: Never   Tobacco comments:    mom in the home; black and mild every other day  Vaping Use   Vaping status: Never Used  Substance Use Topics   Alcohol use: Yes    Comment: occ   Drug use:  Never     Allergies   Patient has no known allergies.   Review of Systems Review of Systems  Musculoskeletal:  Positive for gait problem.       Right foot and ankle pain that radiates all the way up the leg   Neurological:  Negative for numbness.       Throbbing and tingling in the right foot   All other systems reviewed and are negative.    Physical Exam Triage Vital Signs ED Triage Vitals  Encounter Vitals Group     BP 01/19/24 1013 99/68     Girls Systolic BP Percentile --      Girls Diastolic BP Percentile --      Boys Systolic BP Percentile --      Boys Diastolic BP Percentile --      Pulse Rate 01/19/24 1013 83     Resp 01/19/24 1013 17     Temp 01/19/24 1013 98.5 F (36.9 C)     Temp Source 01/19/24 1013 Oral     SpO2 01/19/24 1013 98 %     Weight --      Height --      Head Circumference --      Peak Flow --      Pain Score 01/19/24 1010 10     Pain Loc --      Pain Education --      Exclude from Growth Chart --    No data found.  Updated Vital Signs BP 99/68   Pulse 83   Temp 98.5 F (36.9 C) (Oral)   Resp 17   LMP 12/25/2023   SpO2 98%   Visual Acuity Right Eye Distance:   Left Eye Distance:   Bilateral Distance:    Right Eye Near:   Left Eye Near:    Bilateral Near:     Physical Exam Vitals reviewed.  Constitutional:      General: She is awake. She is not in acute distress.    Appearance: Normal appearance. She is well-developed. She is not ill-appearing, toxic-appearing or diaphoretic.  HENT:     Head: Normocephalic.     Right Ear: Hearing normal.     Left Ear: Hearing normal.     Nose: Nose normal.     Mouth/Throat:     Mouth: Mucous membranes are moist.  Eyes:     General: Vision grossly intact.     Conjunctiva/sclera: Conjunctivae normal.  Cardiovascular:     Rate and Rhythm: Normal rate and regular rhythm.     Pulses:          Dorsalis pedis pulses are 2+ on the right side.     Heart sounds: Normal heart sounds.   Pulmonary:     Effort: Pulmonary effort is normal.     Breath sounds: Normal breath sounds and air entry.  Musculoskeletal:        General: Normal range of motion.     Cervical back: Full passive range of motion without pain, normal range of motion and neck supple.     Right ankle: No swelling. Tenderness present over the lateral malleolus. Normal range of motion.     Right foot: Normal range of motion and normal capillary refill. Swelling and tenderness present. No deformity or crepitus.  Feet:     Right foot:     Skin integrity: Skin integrity normal.     Toenail Condition: Right toenails are normal.  Skin:    General: Skin is warm and dry.  Neurological:     General: No focal deficit present.     Mental Status: She is alert and oriented to person, place, and time.     Sensory: Sensation is intact. No sensory deficit.     Motor: Motor function is intact. No weakness.     Comments: Gait not observed   Psychiatric:        Speech: Speech normal.        Behavior: Behavior is cooperative.      UC Treatments / Results  Labs (all labs ordered are listed, but only abnormal results are displayed) Labs Reviewed - No data to display  EKG   Radiology DG Foot Complete Left Result Date: 01/19/2024 CLINICAL DATA:  Left foot pain, injury playing basketball. EXAM: LEFT FOOT - COMPLETE 3+ VIEW COMPARISON:  None Available. FINDINGS: There is no evidence of fracture or dislocation. There is no evidence of arthropathy or other focal bone abnormality. Soft tissues are unremarkable. IMPRESSION: Negative. Electronically Signed   By: Franky Crease M.D.   On: 01/19/2024 11:20    Procedures Procedures (including critical care time)  Medications Ordered in UC Medications - No data to display  Initial Impression / Assessment and Plan / UC Course  I have reviewed the triage vital signs and the nursing notes.  Pertinent labs & imaging results that were available during my care of the patient  were reviewed by me and considered in my medical decision making (see chart for details).     Patient presents with left foot pain following a basketball injury sustained last night after landing awkwardly during play. He reports localized pain in the foot with radiation into the ankle and up the leg, along with swelling and a tingling sensation. The patient is able to move his toes but experiences pain with ankle movement. No prior treatment had been attempted aside from Tylenol  and leg elevation. Given the mechanism of injury and symptoms, there was concern for possible fracture or sprain. X-rays of the foot and ankle were obtained and showed no evidence of fracture, dislocation, or other bony abnormality. The injury is most consistent with a sprain. Treatment includes over-the-counter NSAIDs for pain and inflammation, weight bearing as tolerated (WBAT), use of a supportive boot, and the RICE protocol (rest, ice, compression, elevation). Patient was advised to follow up with orthopedics if there is no improvement within 7-10 days. He was also instructed to seek emergency care if pain significantly worsens, swelling increases, toe movement becomes limited, or if there are signs of impaired circulation such as numbness, coldness, or discoloration of the foot.  Today's evaluation has revealed no signs of a dangerous process. Discussed diagnosis with patient and/or guardian. Patient and/or guardian aware of their diagnosis, possible red flag symptoms to watch out for and need for close follow up. Patient and/or guardian understands verbal and  written discharge instructions. Patient and/or guardian comfortable with plan and disposition.  Patient and/or guardian has a clear mental status at this time, good insight into illness (after discussion and teaching) and has clear judgment to make decisions regarding their care  Documentation was completed with the aid of voice recognition software. Transcription may  contain typographical errors. Final Clinical Impressions(s) / UC Diagnoses   Final diagnoses:  Acute foot pain, left  Sprain of left foot, initial encounter     Discharge Instructions      You have been evaluated for left foot pain following a basketball injury. X-rays of your foot and ankle were negative for any fracture, dislocation, or bony abnormality, and your injury is most consistent with a sprain. You may treat this at home with over-the-counter anti-inflammatory medications such as ibuprofen  or naproxen to help manage pain and swelling. Use a supportive walking boot and limit activity as needed, but you may bear weight on the foot as tolerated. Apply ice to the injured area for 15 to 20 minutes at a time, several times a day, and keep the foot elevated when resting to reduce swelling. Avoid high-impact activities or sports until the pain has significantly improved. If your symptoms do not improve within 7 to 10 days, follow up with an orthopedic specialist for further evaluation. Seek immediate medical attention if you develop severe or worsening pain, increased swelling, inability to move your toes, or signs of poor circulation such as numbness, coolness, or discoloration of the foot.      ED Prescriptions   None    PDMP not reviewed this encounter.   Iola Lukes, [place] 01/19/24 1129

## 2024-01-24 DIAGNOSIS — S93609A Unspecified sprain of unspecified foot, initial encounter: Secondary | ICD-10-CM | POA: Diagnosis not present

## 2024-01-24 DIAGNOSIS — M79672 Pain in left foot: Secondary | ICD-10-CM | POA: Diagnosis not present

## 2024-04-05 DIAGNOSIS — H6123 Impacted cerumen, bilateral: Secondary | ICD-10-CM | POA: Diagnosis not present

## 2024-04-05 DIAGNOSIS — H66002 Acute suppurative otitis media without spontaneous rupture of ear drum, left ear: Secondary | ICD-10-CM | POA: Diagnosis not present

## 2024-04-12 DIAGNOSIS — H66002 Acute suppurative otitis media without spontaneous rupture of ear drum, left ear: Secondary | ICD-10-CM | POA: Diagnosis not present
